# Patient Record
Sex: Female | Born: 2000 | Race: Black or African American | Hispanic: No | Marital: Single | Smoking: Never smoker
Health system: Southern US, Community
[De-identification: ages and names within clinical notes are randomized; demographics above are authoritative.]

## PROBLEM LIST (undated history)

## (undated) DIAGNOSIS — F332 Major depressive disorder, recurrent severe without psychotic features: Secondary | ICD-10-CM

## (undated) DIAGNOSIS — E669 Obesity, unspecified: Secondary | ICD-10-CM

---
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
## 2019-09-26 ENCOUNTER — Other Ambulatory Visit: Payer: Self-pay

## 2019-09-26 ENCOUNTER — Emergency Department (HOSPITAL_COMMUNITY): Payer: Self-pay

## 2019-09-26 ENCOUNTER — Encounter (HOSPITAL_COMMUNITY): Payer: Self-pay

## 2019-09-26 ENCOUNTER — Emergency Department (HOSPITAL_COMMUNITY)
Admission: EM | Admit: 2019-09-26 | Discharge: 2019-09-26 | Disposition: A | Discharge status: Home / Self Care | Payer: Self-pay | Attending: Emergency Medicine | Admitting: Emergency Medicine

## 2019-09-26 DIAGNOSIS — N3000 Acute cystitis without hematuria: Secondary | ICD-10-CM | POA: Insufficient documentation

## 2019-09-26 DIAGNOSIS — N83201 Unspecified ovarian cyst, right side: Secondary | ICD-10-CM | POA: Insufficient documentation

## 2019-09-26 DIAGNOSIS — N739 Female pelvic inflammatory disease, unspecified: Secondary | ICD-10-CM | POA: Insufficient documentation

## 2019-09-26 DIAGNOSIS — Z113 Encounter for screening for infections with a predominantly sexual mode of transmission: Secondary | ICD-10-CM | POA: Insufficient documentation

## 2019-09-26 DIAGNOSIS — N730 Acute parametritis and pelvic cellulitis: Secondary | ICD-10-CM

## 2019-09-26 DIAGNOSIS — M549 Dorsalgia, unspecified: Secondary | ICD-10-CM | POA: Insufficient documentation

## 2019-09-26 DIAGNOSIS — R1032 Left lower quadrant pain: Secondary | ICD-10-CM | POA: Insufficient documentation

## 2019-09-26 LAB — LIPASE, BLOOD: Lipase: 22 U/L (ref 11–51)

## 2019-09-26 LAB — COMPREHENSIVE METABOLIC PANEL
ALT: 17 U/L (ref 0–44)
AST: 13 U/L — ABNORMAL LOW (ref 15–41)
Albumin: 4.1 g/dL (ref 3.5–5.0)
Alkaline Phosphatase: 52 U/L (ref 38–126)
Anion gap: 11 (ref 5–15)
BUN: 13 mg/dL (ref 6–20)
CO2: 25 mmol/L (ref 22–32)
Calcium: 9.3 mg/dL (ref 8.9–10.3)
Chloride: 103 mmol/L (ref 98–111)
Creatinine, Ser: 0.62 mg/dL (ref 0.44–1.00)
GFR calc Af Amer: >60 mL/min (ref >60)
GFR calc non Af Amer: >60 mL/min (ref >60)
Glucose, Bld: 79 mg/dL (ref 70–99)
Potassium: 3.4 mmol/L — ABNORMAL LOW (ref 3.5–5.1)
Sodium: 139 mmol/L (ref 135–145)
Total Bilirubin: 1 mg/dL (ref 0.3–1.2)
Total Protein: 7.9 g/dL (ref 6.5–8.1)

## 2019-09-26 LAB — WET PREP, GENITAL
Clue Cells Wet Prep HPF POC: NONE SEEN
Sperm: NONE SEEN
Trich, Wet Prep: NONE SEEN
Yeast Wet Prep HPF POC: NONE SEEN

## 2019-09-26 LAB — URINALYSIS, ROUTINE W REFLEX MICROSCOPIC
Bacteria, UA: NONE SEEN
Bilirubin Urine: NEGATIVE
Glucose, UA: NEGATIVE mg/dL
Hgb urine dipstick: NEGATIVE
Ketones, ur: 80 mg/dL — AB
Leukocytes,Ua: NEGATIVE
Nitrite: POSITIVE — AB
Protein, ur: 100 mg/dL — AB
Specific Gravity, Urine: 1.03 (ref 1.005–1.030)
pH: 6 (ref 5.0–8.0)

## 2019-09-26 LAB — CBC
HCT: 34.6 % — ABNORMAL LOW (ref 36.0–46.0)
Hemoglobin: 10.6 g/dL — ABNORMAL LOW (ref 12.0–15.0)
MCH: 25.7 pg — ABNORMAL LOW (ref 26.0–34.0)
MCHC: 30.6 g/dL (ref 30.0–36.0)
MCV: 83.8 fL (ref 80.0–100.0)
Platelets: 267 10*3/uL (ref 150–400)
RBC: 4.13 MIL/uL (ref 3.87–5.11)
RDW: 17.5 % — ABNORMAL HIGH (ref 11.5–15.5)
WBC: 15.3 10*3/uL — ABNORMAL HIGH (ref 4.0–10.5)
nRBC: 0 % (ref 0.0–0.2)

## 2019-09-26 LAB — HCG, QUANTITATIVE, PREGNANCY: hCG, Beta Chain, Quant, S: <1 m[IU]/mL (ref <5)

## 2019-09-26 MED ORDER — AZITHROMYCIN 1 G PO PACK
1.0000 g | PACK | Freq: Once | ORAL | Status: AC
  Administered 2019-09-26: 1 g via ORAL
  Filled 2019-09-26: qty 1

## 2019-09-26 MED ORDER — SODIUM CHLORIDE 0.9% FLUSH: 3.0000 mL | Freq: Once | INTRAVENOUS | Status: DC

## 2019-09-26 MED ORDER — CEFTRIAXONE SODIUM 1 G IJ SOLR
500.0000 mg | Freq: Once | INTRAMUSCULAR | Status: AC
Start: 2019-09-26 — End: 2019-09-26
  Administered 2019-09-26: 500 mg via INTRAMUSCULAR
  Filled 2019-09-26: qty 10

## 2019-09-26 MED ORDER — DOXYCYCLINE HYCLATE 100 MG PO CAPS: 100.0000 mg | ORAL_CAPSULE | Freq: Two times a day (BID) | ORAL | 0 refills | Status: DC

## 2019-09-26 MED ORDER — SODIUM CHLORIDE 0.9 % IV BOLUS
1000.0000 mL | Freq: Once | INTRAVENOUS | Status: AC
  Administered 2019-09-26: 1000 mL via INTRAVENOUS

## 2019-09-26 NOTE — ED Provider Notes (Signed)
[facility] DEPT Provider Note   CSN: [id number] Arrival date & time: 09/26/19  1241     History Chief Complaint  Patient presents with  . Abdominal Pain  . Back Pain  . Headache    Atianna Peck is a 19 y.o. female hx of depression, here presenting with back pain, abdominal pain, chills, vaginal discharge. Patient states that she has not been feeling well for the last 6 days. She has some occasional back pain and also some dysuria. She is sexually active and had sex several days ago with female. She states that she also has some vaginal discharge as well. She denies any cough or fever. She has not had her Covid vaccine yet but denies any known Covid exposure.  The history is provided by the patient.       Past Medical History:  Diagnosis Date  . MDD (major depressive disorder), recurrent severe, without psychosis ([place]) 02/01/2016  . Obesity     Patient Active Problem List   Diagnosis Date Noted  . DMDD (disruptive mood dysregulation disorder) ([employer]) 04/12/2016  . MDD (major depressive disorder), recurrent episode, severe ([place]) 04/11/2016  . Iron deficiency anemia 02/02/2016  . Suicidal ideation 02/01/2016  . MDD (major depressive disorder), recurrent severe, without psychosis ([place]) 02/01/2016    History reviewed. No pertinent surgical history.   OB History   No obstetric history on file.     Family History  Problem Relation Age of Onset  . Kidney disease Mother     Social History   Tobacco Use  . Smoking status: Never Smoker  . Smokeless tobacco: Never Used  Substance Use Topics  . Alcohol use: No  . Drug use: Yes    Frequency: 1.0 times per week    Types: Marijuana    Comment: last use was yesterday    Home Medications Prior to Admission medications   Medication Sig Start Date End Date Taking? Authorizing Provider  ARIPiprazole (ABILIFY) 5 MG tablet Take 1 tablet (5 mg total) by mouth daily. Patient not taking: Reported  on 11/25/2017 04/18/16   Suella Broad, FNP  ferrous sulfate 325 (65 FE) MG tablet Take 1 tablet (325 mg total) by mouth daily with breakfast. Patient not taking: Reported on 11/25/2017 02/05/16   Mordecai Maes, NP    Allergies    Patient has no known allergies.  Review of Systems   Review of Systems  Gastrointestinal: Positive for abdominal pain.  Musculoskeletal: Positive for back pain.  Neurological: Positive for headaches.  All other systems reviewed and are negative.   Physical Exam Updated Vital Signs BP 122/67 (BP Location: Left Arm)   Pulse 71   Temp 98.5 F (36.9 C) (Oral)   Resp 20   Ht 5\' 4"  (1.626 m)   Wt 97.4 kg   LMP 09/19/2019   SpO2 100%   BMI 36.87 kg/m   Physical Exam Vitals and nursing note reviewed.  Constitutional:      Appearance: She is well-developed.  HENT:     Head: Normocephalic.     Comments: No meningeal signs  Eyes:     Extraocular Movements: Extraocular movements intact.  Cardiovascular:     Rate and Rhythm: Normal rate and regular rhythm.  Pulmonary:     Effort: Pulmonary effort is normal.     Breath sounds: Normal breath sounds.  Abdominal:     General: Abdomen is flat.     Comments: No CVAT   Genitourinary:    Cervix:  Normal.     Uterus: Normal.      Comments: Yellowish discharge. Mild L adnexal tenderness  Skin:    General: Skin is warm.     Capillary Refill: Capillary refill takes less than 2 seconds.  Neurological:     General: No focal deficit present.     Mental Status: She is alert.  Psychiatric:        Mood and Affect: Mood normal.        Behavior: Behavior normal.     ED Results / Procedures / Treatments   Labs (all labs ordered are listed, but only abnormal results are displayed) Labs Reviewed  COMPREHENSIVE METABOLIC PANEL - Abnormal; Notable for the following components:      Result Value   Potassium 3.4 (*)    AST 13 (*)    All other components within normal limits  CBC - Abnormal; Notable  for the following components:   WBC 15.3 (*)    Hemoglobin 10.6 (*)    HCT 34.6 (*)    MCH 25.7 (*)    RDW 17.5 (*)    All other components within normal limits  WET PREP, GENITAL  URINE CULTURE  LIPASE, BLOOD  HCG, QUANTITATIVE, PREGNANCY  URINALYSIS, ROUTINE W REFLEX MICROSCOPIC  GC/CHLAMYDIA PROBE AMP ([place]) NOT AT [facility]    EKG None  Radiology No results found.  Procedures Procedures (including critical care time)  Medications Ordered in ED Medications  sodium chloride flush (NS) 0.9 % injection 3 mL (has no administration in time range)  sodium chloride 0.9 % bolus 1,000 mL (has no administration in time range)  cefTRIAXone (ROCEPHIN) injection 500 mg (has no administration in time range)  azithromycin (ZITHROMAX) powder 1 g (has no administration in time range)    ED Course  I have reviewed the triage vital signs and the nursing notes.  Pertinent labs & imaging results that were available during my care of the patient were reviewed by me and considered in my medical decision making (see chart for details).    MDM Rules/Calculators/A&P                      Laura Peck is a 19 y.o. female here with chills, back pain, dysuria, vaginal discharge. Patient has low-grade temperature but otherwise well-appearing. Suspect PID versus cystitis. Patient has no meningeal signs and has no cough and has clear lungs . Will get CBC, CMP, urinalysis, wet prep, GC and chlamydia.  10:45 PM Patient's labs showed WBC 15.  Pelvic exam has mild left adnexal tenderness.  Ultrasound showed complex cyst on the right side likely hemorrhagic cysts and small left-sided hydrosalpinx.  She may have PID.  She was given a dose of Rocephin and azithromycin.  Patient will be discharged home with doxycycline for PID and to treat her UTI.  I told her that she will be called if she has positive gonorrhea or chlamydia test and her partner will need treatment if that is the case.  She will need to  GYN follow-up for repeat pelvic ultrasound.   Final Clinical Impression(s) / ED Diagnoses Final diagnoses:  None    Rx / DC Orders ED Discharge Orders    None       Charlynne Pander, MD 09/26/19 2247

## 2019-09-26 NOTE — ED Notes (Signed)
ED Provider at bedside. 

## 2019-09-26 NOTE — Discharge Instructions (Addendum)
Take doxycycline twice daily for a week   You are tested for gonorrhea and chlamydia and we will call for positive results.  In that case, your partner will need treatment.  Please use condoms.  You have an ovarian cyst that will need follow-up with GYN doctor and repeat ultrasound in 6 weeks.  Return to ER if you have worse abdominal pain, vaginal discharge, flank pain, vomiting, fever

## 2019-09-26 NOTE — ED Triage Notes (Signed)
Patient c/o headache, back pain, abdominal pain, and chills x 6 days ago.

## 2019-09-27 LAB — URINE CULTURE

## 2019-09-28 LAB — GC/CHLAMYDIA PROBE AMP (~~LOC~~) NOT AT ARMC
Chlamydia: NEGATIVE
Comment: NEGATIVE
Comment: NORMAL
Neisseria Gonorrhea: POSITIVE — AB

## 2020-05-31 IMAGING — US US PELVIS COMPLETE TRANSABD/TRANSVAG W DUPLEX
1 series · 13 of 25 positions shown · non-contrast
Comparison: None available.

CLINICAL DATA: Initial evaluation for acute left lower quadrant
pain for 1 week.

EXAM:
TRANSABDOMINAL AND TRANSVAGINAL ULTRASOUND OF PELVIS
DOPPLER ULTRASOUND OF OVARIES
TECHNIQUE: Both transabdominal and transvaginal ultrasound examinations of the
pelvis were performed. Transabdominal technique was performed for
global imaging of the pelvis including uterus, ovaries, adnexal
regions, and pelvic cul-de-sac.
It was necessary to proceed with endovaginal exam following the
transabdominal exam to visualize the pelvic structures. Color and
duplex Doppler ultrasound was utilized to evaluate blood flow to the
ovaries.

[Series 1: us pelvis complete transabd/transvag w duplex · 100 acquisitions, 13 frames shown]
[im 1/100]
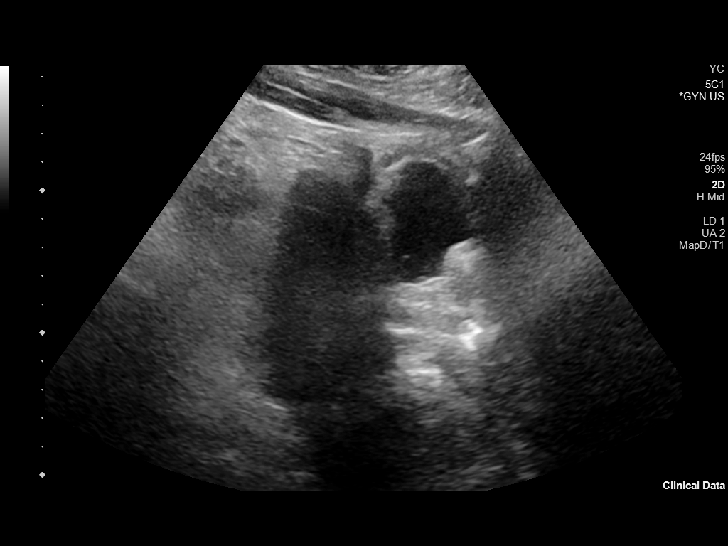
[im 9/100]
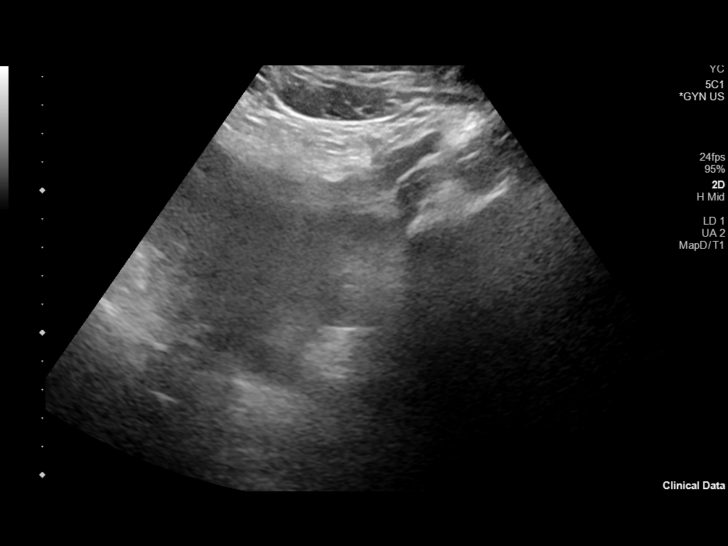
[im 17/100]
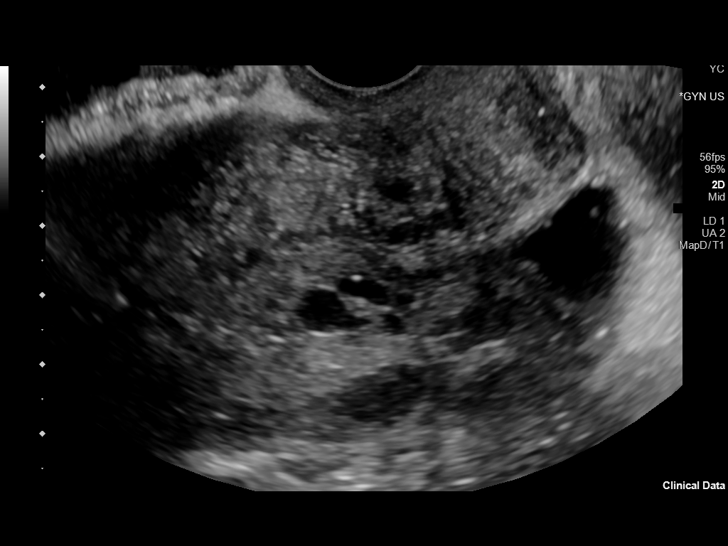
[im 25/100]
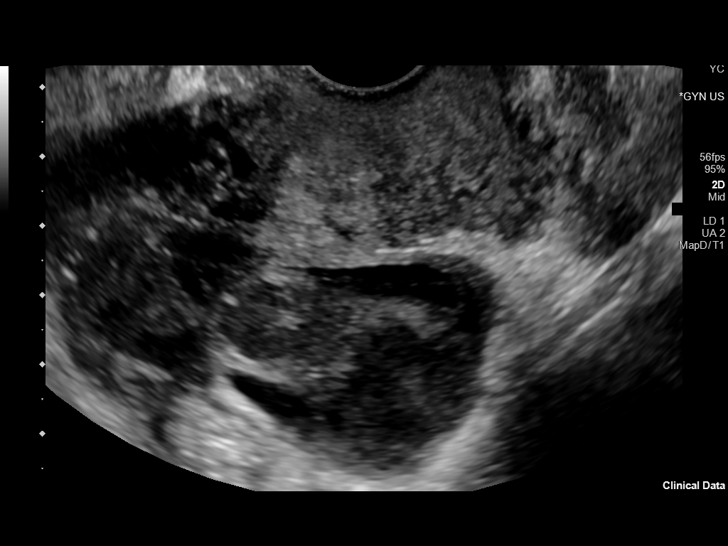
[im 34/100]
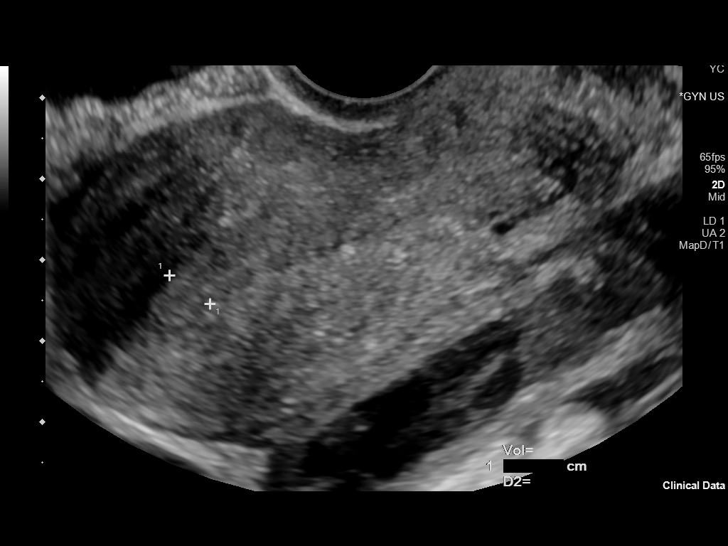
[im 42/100]
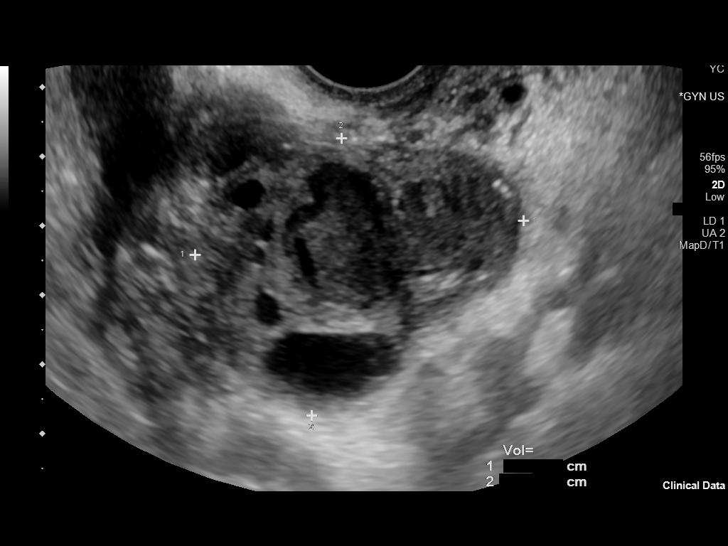
[im 50/100]
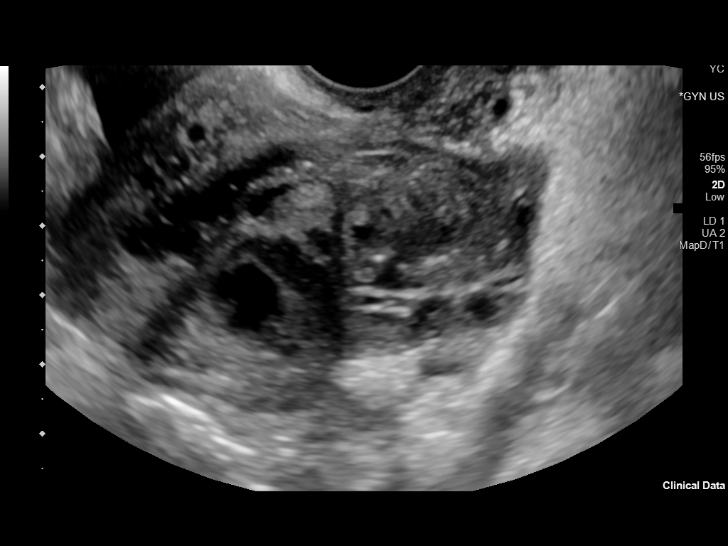
[im 58/100]
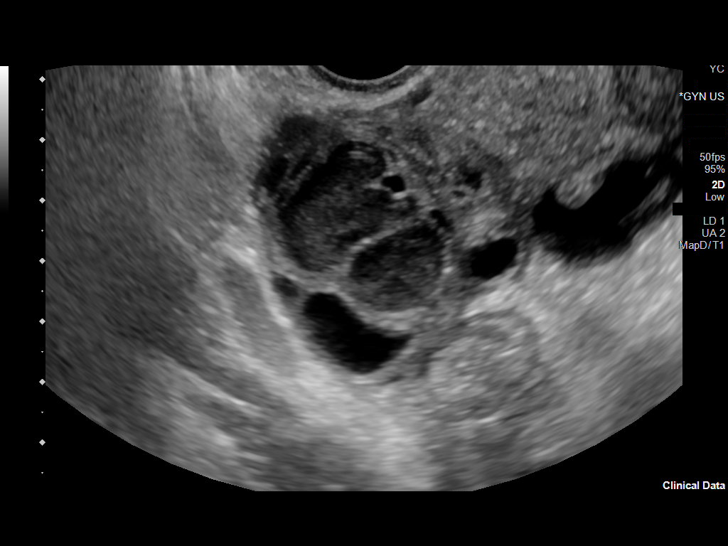
[im 67/100]
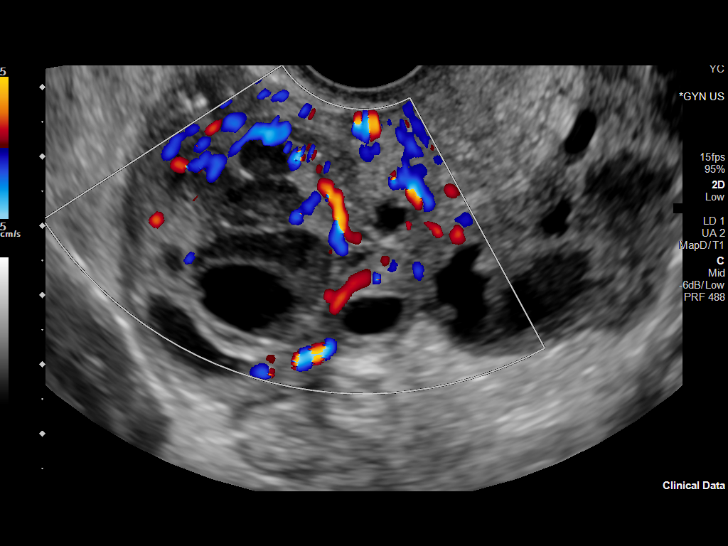
[im 75/100]
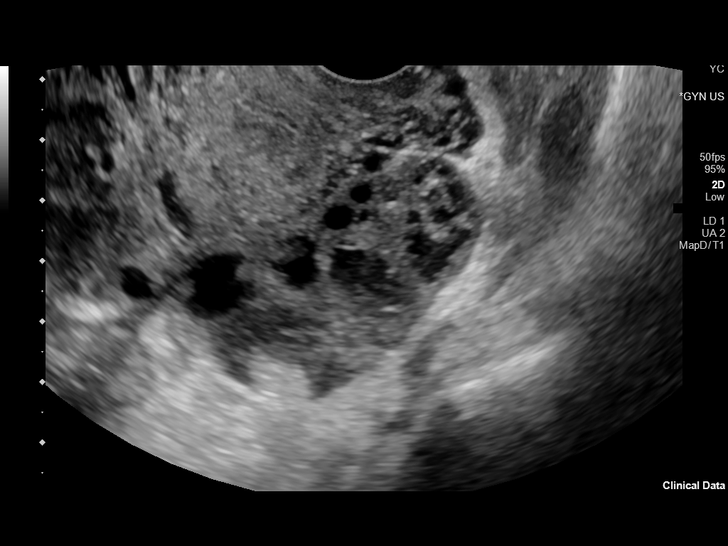
[im 83/100]
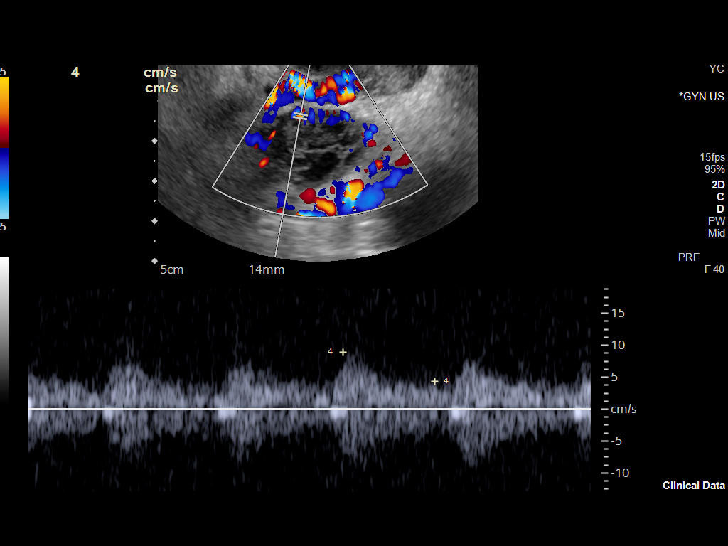
[im 91/100]
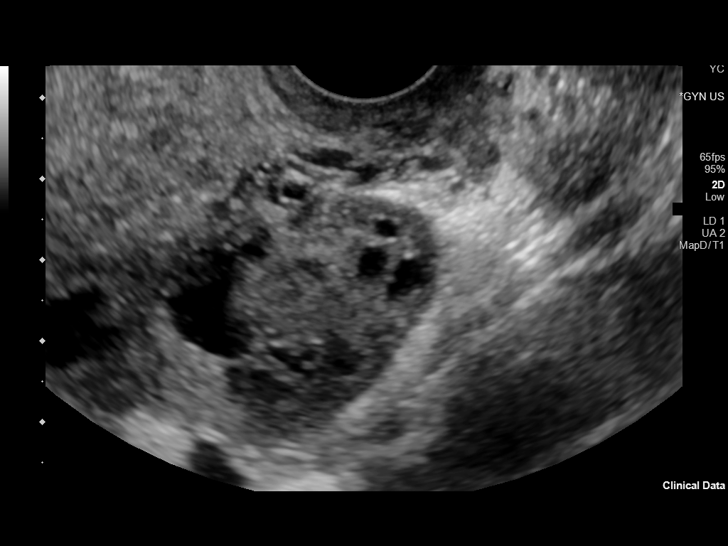
[im 100/100]
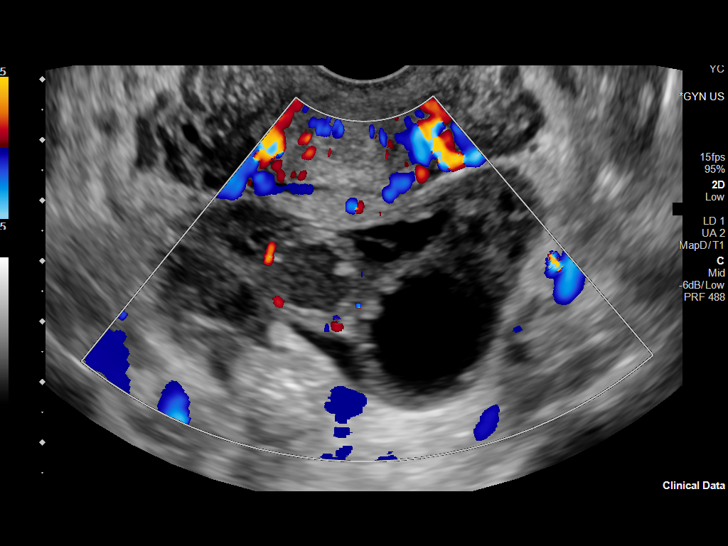

[13 of 25 positions shown; findings below may reference images not displayed]

FINDINGS: Uterus

Measurements: 8.5 x 4.1 x 4.3 cm = volume: 78.4 mL. No fibroids or
other mass visualized.

Endometrium

Thickness: 6.1 mm.  No focal abnormality visualized.

Right ovary

Measurements: 4.8 x 4.0 x 3.5 cm = volume: 35.5 mL. Complex cyst
versus cluster of cysts measuring 3.7 x 2.7 x 2.5 cm seen within the
right ovary. Scattered areas of vascularity favored to lie within
intervening ovarian parenchyma. No definite solid component.

Left ovary

Measurements: 4.1 x 2.4 x 2.6 cm = volume: 13.6 mL. Left ovary
demonstrates a normal appearance. Probable small left-sided
hydrosalpinx noted. No other adnexal mass.

Pulsed Doppler evaluation of both ovaries demonstrates normal
low-resistance arterial and venous waveforms.

Other findings

Small to moderate free fluid within the pelvis.
IMPRESSION: 1. Complex cyst versus cluster of cysts measuring up to 3.7 cm
arising from the right ovary, favored to reflect complex physiologic
follicular cyst(s) versus hemorrhagic cyst(s). While these are
almost certainly benign, a short interval follow-up ultrasound in
6-12 weeks to ensure resolution is suggested.
2. Probable small left-sided hydrosalpinx.
3. Small to moderate free fluid within the pelvis, likely
physiologic.
4. No other acute abnormality identified.  No evidence for torsion.

## 2022-12-14 DIAGNOSIS — B9789 Other viral agents as the cause of diseases classified elsewhere: Secondary | ICD-10-CM | POA: Diagnosis not present

## 2022-12-14 DIAGNOSIS — L299 Pruritus, unspecified: Secondary | ICD-10-CM | POA: Diagnosis not present

## 2022-12-14 DIAGNOSIS — N760 Acute vaginitis: Secondary | ICD-10-CM | POA: Diagnosis not present

## 2022-12-14 DIAGNOSIS — Z556 Problems related to health literacy: Secondary | ICD-10-CM | POA: Diagnosis not present

## 2022-12-14 DIAGNOSIS — N898 Other specified noninflammatory disorders of vagina: Secondary | ICD-10-CM | POA: Diagnosis not present

## 2023-01-11 DIAGNOSIS — B368 Other specified superficial mycoses: Secondary | ICD-10-CM | POA: Diagnosis not present

## 2023-01-11 DIAGNOSIS — R21 Rash and other nonspecific skin eruption: Secondary | ICD-10-CM | POA: Diagnosis not present

## 2023-01-11 DIAGNOSIS — B369 Superficial mycosis, unspecified: Secondary | ICD-10-CM | POA: Diagnosis not present

## 2023-03-09 DIAGNOSIS — L723 Sebaceous cyst: Secondary | ICD-10-CM | POA: Diagnosis not present

## 2024-04-04 ENCOUNTER — Emergency Department (HOSPITAL_COMMUNITY)
Admission: EM | Admit: 2024-04-04 | Discharge: 2024-04-05 | Disposition: A | Discharge status: Home / Self Care | Attending: Emergency Medicine | Admitting: Emergency Medicine

## 2024-04-04 DIAGNOSIS — N760 Acute vaginitis: Secondary | ICD-10-CM | POA: Insufficient documentation

## 2024-04-04 DIAGNOSIS — N946 Dysmenorrhea, unspecified: Secondary | ICD-10-CM | POA: Insufficient documentation

## 2024-04-04 DIAGNOSIS — N898 Other specified noninflammatory disorders of vagina: Secondary | ICD-10-CM | POA: Diagnosis present

## 2024-04-04 DIAGNOSIS — B9689 Other specified bacterial agents as the cause of diseases classified elsewhere: Secondary | ICD-10-CM

## 2024-04-04 LAB — CBC WITH DIFFERENTIAL/PLATELET
Abs Immature Granulocytes: 0.03 K/uL (ref 0.00–0.07)
Basophils Absolute: 0.1 K/uL (ref 0.0–0.1)
Basophils Relative: 1 %
Eosinophils Absolute: 0.1 K/uL (ref 0.0–0.5)
Eosinophils Relative: 2 %
HCT: 31.2 % — ABNORMAL LOW (ref 36.0–46.0)
Hemoglobin: 9.7 g/dL — ABNORMAL LOW (ref 12.0–15.0)
Immature Granulocytes: 0 %
Lymphocytes Relative: 32 %
Lymphs Abs: 3 K/uL (ref 0.7–4.0)
MCH: 25.7 pg — ABNORMAL LOW (ref 26.0–34.0)
MCHC: 31.1 g/dL (ref 30.0–36.0)
MCV: 82.5 fL (ref 80.0–100.0)
Monocytes Absolute: 0.8 K/uL (ref 0.1–1.0)
Monocytes Relative: 9 %
Neutro Abs: 5.3 K/uL (ref 1.7–7.7)
Neutrophils Relative %: 56 %
Platelets: 271 K/uL (ref 150–400)
RBC: 3.78 MIL/uL — ABNORMAL LOW (ref 3.87–5.11)
RDW: 16.6 % — ABNORMAL HIGH (ref 11.5–15.5)
WBC: 9.4 K/uL (ref 4.0–10.5)
nRBC: 0 % (ref 0.0–0.2)

## 2024-04-04 LAB — COMPREHENSIVE METABOLIC PANEL WITH GFR
ALT: 9 U/L (ref 0–44)
AST: 19 U/L (ref 15–41)
Albumin: 4.2 g/dL (ref 3.5–5.0)
Alkaline Phosphatase: 46 U/L (ref 38–126)
Anion gap: 10 (ref 5–15)
BUN: 12 mg/dL (ref 6–20)
CO2: 22 mmol/L (ref 22–32)
Calcium: 9.2 mg/dL (ref 8.9–10.3)
Chloride: 106 mmol/L (ref 98–111)
Creatinine, Ser: 0.73 mg/dL (ref 0.44–1.00)
GFR, Estimated: >60 mL/min (ref >60)
Glucose, Bld: 114 mg/dL — ABNORMAL HIGH (ref 70–99)
Potassium: 3.6 mmol/L (ref 3.5–5.1)
Sodium: 138 mmol/L (ref 135–145)
Total Bilirubin: 0.3 mg/dL (ref 0.0–1.2)
Total Protein: 7 g/dL (ref 6.5–8.1)

## 2024-04-04 LAB — HCG, SERUM, QUALITATIVE: Preg, Serum: NEGATIVE

## 2024-04-04 NOTE — ED Triage Notes (Signed)
 Patient brought in by POV with c/o spotting for 2 weeks. Denies seeing OBGYN. Denies any burning or foul odor. Some itching.

## 2024-04-04 NOTE — ED Notes (Signed)
Pt unable to provide urine sample at this time. Water given to pt.

## 2024-04-04 NOTE — ED Provider Notes (Signed)
 [place] EMERGENCY DEPARTMENT AT [facility] Provider Note   CSN: [id number] Arrival date & time: 04/04/24  2149     Patient presents with: No chief complaint on file.   Laura Peck is a 23 y.o. female.  Patient is  a 23 year old female with a history of iron deficiency anemia who presents to the ED for vaginal spotting for the past 2 months.  She also notes she has had some increased vaginal discharge.  She states her last normal menstrual cycle was approximately a month and a half ago.  She notes her period initially started normally this month but she has had continued spotting.  Denies concerns for pregnancy or STDs.  Denies fevers, abdominal pain, malodorous to the vaginal area, dysuria, hematuria.  Has not noticed any rashes.  No further complaints.   HPI     Prior to Admission medications   Medication Sig Start Date End Date Taking? Authorizing Provider  metroNIDAZOLE  (FLAGYL ) 500 MG tablet Take 1 tablet (500 mg total) by mouth 2 (two) times daily. 04/05/24  Yes Makenzy Krist, Thersia RAMAN, PA-C  ARIPiprazole  (ABILIFY ) 5 MG tablet Take 1 tablet (5 mg total) by mouth daily. Patient not taking: Reported on 11/25/2017 04/18/16   Wilkie Majel RAMAN, FNP  doxycycline  (VIBRAMYCIN ) 100 MG capsule Take 1 capsule (100 mg total) by mouth 2 (two) times daily. One po bid x 7 days 09/26/19   Patt Alm Macho, MD  ferrous sulfate  325 (65 FE) MG tablet Take 1 tablet (325 mg total) by mouth daily with breakfast. Patient not taking: Reported on 11/25/2017 02/05/16   Thomas, Lashunda, NP    Allergies: Patient has no known allergies.    Review of Systems  Constitutional:  Negative for chills and fever.  Respiratory:  Negative for shortness of breath.   Cardiovascular:  Negative for chest pain.  Genitourinary:  Positive for vaginal bleeding and vaginal discharge. Negative for decreased urine volume, dysuria, hematuria and vaginal pain.  Neurological:  Negative for dizziness and  headaches.  All other systems reviewed and are negative.   Updated Vital Signs BP 120/63   Pulse 90   Temp 98.9 F (37.2 C) (Oral)   Resp 18   Ht 5' 4 (1.626 m)   Wt 95.3 kg   SpO2 100%   BMI 36.05 kg/m   Physical Exam Constitutional:      Appearance: Normal appearance.  HENT:     Head: Normocephalic and atraumatic.     Nose: Nose normal.     Mouth/Throat:     Mouth: Mucous membranes are moist.     Pharynx: Oropharynx is clear.  Cardiovascular:     Rate and Rhythm: Normal rate.  Pulmonary:     Effort: Pulmonary effort is normal.  Abdominal:     General: Bowel sounds are normal.     Palpations: Abdomen is soft.     Tenderness: There is no abdominal tenderness.  Skin:    General: Skin is warm and dry.  Neurological:     Mental Status: She is alert and oriented to person, place, and time.  Psychiatric:        Mood and Affect: Mood normal.        Behavior: Behavior normal.     (all labs ordered are listed, but only abnormal results are displayed) Labs Reviewed  WET PREP, GENITAL - Abnormal; Notable for the following components:      Result Value   Clue Cells Wet Prep HPF POC PRESENT (*)  All other components within normal limits  URINALYSIS, ROUTINE W REFLEX MICROSCOPIC - Abnormal; Notable for the following components:   APPearance HAZY (*)    Specific Gravity, Urine 1.031 (*)    Hgb urine dipstick MODERATE (*)    Bacteria, UA RARE (*)    All other components within normal limits  COMPREHENSIVE METABOLIC PANEL WITH GFR - Abnormal; Notable for the following components:   Glucose, Bld 114 (*)    All other components within normal limits  CBC WITH DIFFERENTIAL/PLATELET - Abnormal; Notable for the following components:   RBC 3.78 (*)    Hemoglobin 9.7 (*)    HCT 31.2 (*)    MCH 25.7 (*)    RDW 16.6 (*)    All other components within normal limits  HCG, SERUM, QUALITATIVE  GC/CHLAMYDIA PROBE AMP ([place]) NOT AT [facility]    EKG: None  Radiology: No  results found.    Medications Ordered in the ED - No data to display  Clinical Course as of 04/05/24 0109  Tue Apr 04, 2024  2357 Hemoglobin(!): 9.7 Slightly decreased from baseline but stable [AY]  Wed Apr 05, 2024  0024 Clue Cells Wet Prep HPF POC(!): PRESENT [AY]    Clinical Course User Index [AY] Neysa Thersia RAMAN, PA-C                                Medical Decision Making Amount and/or Complexity of Data Reviewed Labs: ordered. Decision-making details documented in ED Course.  Risk Prescription drug management.   Patient is a 23 year old female with a history of anemia who presents to the ED for abnormal menstrual bleeding for the past 2 weeks as well as vaginal discharge.  Please see detailed HPI above.  On exam patient is alert and in no acute distress.  Physical exam as noted above.  No abdominal or suprapubic tenderness appreciated.    Differential includes UTI, BV, STI, pregnancy, ruptured ovarian cyst, endometriosis.  Labs show mildly decreased hemoglobin of 9.7, previous 4 years prior was 10.6.  UA does show hematuria but no signs of infection.  She notes she is still having some vaginal spotting.  Pregnancy negative.  Wet prep is positive for BV.  Suspect patient's hemoglobin is decreased secondary to intermittent vaginal bleeding for 2 weeks. She is not symptomatic and stable.  Stable for discharge home.  Prescribed Flagyl  for BV.  Symptomatic care discussed.  Resources for gynecology provided for follow-up for continued abnormal menstrual cycles.  Return precautions provided for worsening symptoms.    Final diagnoses:  BV (bacterial vaginosis)  Dysmenorrhea    ED Discharge Orders          Ordered    metroNIDAZOLE  (FLAGYL ) 500 MG tablet  2 times daily        04/05/24 0103               Neysa Thersia RAMAN, PA-C 04/05/24 0109    Lenor Hollering, MD 04/11/24 1501

## 2024-04-05 LAB — URINALYSIS, ROUTINE W REFLEX MICROSCOPIC
Bilirubin Urine: NEGATIVE
Glucose, UA: NEGATIVE mg/dL
Ketones, ur: NEGATIVE mg/dL
Leukocytes,Ua: NEGATIVE
Nitrite: NEGATIVE
Protein, ur: NEGATIVE mg/dL
Specific Gravity, Urine: 1.031 — ABNORMAL HIGH (ref 1.005–1.030)
pH: 5 (ref 5.0–8.0)

## 2024-04-05 LAB — GC/CHLAMYDIA PROBE AMP (~~LOC~~) NOT AT ARMC
Chlamydia: NEGATIVE
Comment: NEGATIVE
Comment: NORMAL
Neisseria Gonorrhea: NEGATIVE

## 2024-04-05 LAB — WET PREP, GENITAL
Sperm: NONE SEEN
Trich, Wet Prep: NONE SEEN
WBC, Wet Prep HPF POC: <10 (ref <10)
Yeast Wet Prep HPF POC: NONE SEEN

## 2024-04-05 MED ORDER — METRONIDAZOLE 500 MG PO TABS: 500.0000 mg | ORAL_TABLET | Freq: Two times a day (BID) | ORAL | 0 refills | Status: DC

## 2024-04-05 NOTE — Discharge Instructions (Addendum)
 Began to take Flagyl  as prescribed.  Please follow-up with gynecology for further evaluation and management of abnormal menstrual cycles.  Call to schedule appointment.  Return to ED if any symptoms worsen including severe uncontrollable bleeding, fatigue/weakness/syncope, severe abdominal pain.

## 2024-04-28 ENCOUNTER — Ambulatory Visit (HOSPITAL_COMMUNITY)
Admission: EM | Admit: 2024-04-28 | Discharge: 2024-04-28 | Disposition: A | Discharge status: Home / Self Care | Attending: Emergency Medicine | Admitting: Emergency Medicine

## 2024-04-28 DIAGNOSIS — Z3202 Encounter for pregnancy test, result negative: Secondary | ICD-10-CM | POA: Diagnosis not present

## 2024-04-28 DIAGNOSIS — N898 Other specified noninflammatory disorders of vagina: Secondary | ICD-10-CM | POA: Insufficient documentation

## 2024-04-28 DIAGNOSIS — Z113 Encounter for screening for infections with a predominantly sexual mode of transmission: Secondary | ICD-10-CM | POA: Insufficient documentation

## 2024-04-28 LAB — POCT URINE PREGNANCY: Preg Test, Ur: NEGATIVE

## 2024-04-28 NOTE — ED Provider Notes (Signed)
 " [facility]    CSN: [id number] Arrival date & time: 04/28/24  1620      History   Chief Complaint Chief Complaint  Patient presents with   Exposure to STD    HPI Laura Peck is a 23 y.o. female.   Patient presents requesting STD testing.  Patient states that she has had some on and off vaginal itching over the last week or so.  Patient denies any abnormal vaginal discharge, vaginal lesions, vaginal pain, abnormal vaginal bleeding, abdominal pain, dysuria, hematuria, and urinary frequency/urgency.  Patient also denies any known exposures to STDs.  Patient also presents with a form to give her clearance to donate plasma and is requesting that we check her iron levels today for this.  Patient reports that she does not currently have a primary care provider.  The history is provided by the patient and medical records.  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  OB History   No obstetric history on file.      Home Medications    Prior to Admission medications  Not on File    Family History Family History  Problem Relation Age of Onset   Kidney disease Mother     Social History Social History[1]   Allergies   Patient has no known allergies.   Review of Systems Review of Systems  Per HPI  Physical Exam Triage Vital Signs ED Triage Vitals [04/28/24 1804]  Encounter Vitals Group     BP 116/60     Girls Systolic BP Percentile      Girls  Diastolic BP Percentile      Boys Systolic BP Percentile      Boys Diastolic BP Percentile      Pulse Rate 62     Resp 18     Temp 98.3 F (36.8 C)     Temp Source Oral     SpO2 98 %     Weight      Height      Head Circumference      Peak Flow      Pain Score      Pain Loc      Pain Education      Exclude from Growth Chart    No data found.  Updated Vital Signs BP 116/60 (BP Location: Left Arm)   Pulse 62   Temp 98.3 F (36.8 C) (Oral)   Resp 18   LMP 03/27/2024 (Approximate)   SpO2 98%   Visual Acuity Right Eye Distance:   Left Eye Distance:   Bilateral Distance:    Right Eye Near:   Left Eye Near:    Bilateral Near:     Physical Exam Vitals and nursing note reviewed.  Constitutional:      General: She is awake. She is not in acute distress.    Appearance: Normal appearance. She is well-developed and well-groomed. She is not ill-appearing.  Genitourinary:    Comments: Exam deferred Skin:    General: Skin  is warm and dry.  Neurological:     Mental Status: She is alert.  Psychiatric:        Behavior: Behavior is cooperative.      UC Treatments / Results  Labs (all labs ordered are listed, but only abnormal results are displayed) Labs Reviewed  POCT URINE PREGNANCY - Normal  HIV ANTIBODY (ROUTINE TESTING W REFLEX)  SYPHILIS: RPR W/REFLEX TO RPR TITER AND TREPONEMAL ANTIBODIES, TRADITIONAL SCREENING AND DIAGNOSIS ALGORITHM  CERVICOVAGINAL ANCILLARY ONLY    EKG   Radiology No results found.  Procedures Procedures (including critical care time)  Medications Ordered in UC Medications - No data to display  Initial Impression / Assessment and Plan / UC Course  I have reviewed the triage vital signs and the nursing notes.  Pertinent labs & imaging results that were available during my care of the patient were reviewed by me and considered in my medical decision making (see chart for details).     Patient is overall well-appearing.  Vitals  are stable.  UPT negative.  GU exam deferred.  Patient perform self swab for STD/STI.  HIV and RPR ordered.  Discussed with patient that we cannot fill out forms clearance to donate plasma and this would have to come from a PCP.  Provided information to get established with a PCP.  Discussed follow-up and return precautions. Final Clinical Impressions(s) / UC Diagnoses   Final diagnoses:  Vaginal itching  Screen for STD (sexually transmitted disease)     Discharge Instructions      Your results will come back over the next few days and someone will call if results are positive and require treatment.  I have attached a family medicine doctor that you can follow-up with to get established with a primary care provider. Return here as needed.    ED Prescriptions   None    PDMP not reviewed this encounter.    [1]  Social History Tobacco Use   Smoking status: Never   Smokeless tobacco: Never  Vaping Use   Vaping status: Never Used  Substance Use Topics   Alcohol use: No   Drug use: Yes    Frequency: 1.0 times per week    Types: Marijuana    Comment: last use was yesterday     Johnie Rumaldo LABOR, NP 04/28/24 1836  "

## 2024-04-28 NOTE — ED Triage Notes (Addendum)
 Patient is here for STI testing.Denies any symptoms. Patient would like her iron check. She has a form to complete so she can donate plasma for money.

## 2024-04-28 NOTE — Discharge Instructions (Signed)
 Your results will come back over the next few days and someone will call if results are positive and require treatment.  I have attached a family medicine doctor that you can follow-up with to get established with a primary care provider. Return here as needed.

## 2024-04-29 LAB — SYPHILIS: RPR W/REFLEX TO RPR TITER AND TREPONEMAL ANTIBODIES, TRADITIONAL SCREENING AND DIAGNOSIS ALGORITHM: RPR Ser Ql: NONREACTIVE

## 2024-04-29 LAB — HIV ANTIBODY (ROUTINE TESTING W REFLEX): HIV Screen 4th Generation wRfx: NONREACTIVE

## 2024-05-01 LAB — CERVICOVAGINAL ANCILLARY ONLY
Comment: NEGATIVE
Comment: NEGATIVE
Comment: NEGATIVE
Comment: NEGATIVE
Comment: NEGATIVE
Comment: NORMAL

## 2024-05-03 ENCOUNTER — Ambulatory Visit (HOSPITAL_COMMUNITY): Payer: Self-pay

## 2024-05-12 ENCOUNTER — Other Ambulatory Visit: Payer: Self-pay

## 2024-05-12 ENCOUNTER — Emergency Department (HOSPITAL_COMMUNITY)
Admission: EM | Admit: 2024-05-12 | Discharge: 2024-05-12 | Disposition: A | Discharge status: Home / Self Care | Attending: Emergency Medicine | Admitting: Emergency Medicine

## 2024-05-12 ENCOUNTER — Emergency Department (HOSPITAL_COMMUNITY)

## 2024-05-12 DIAGNOSIS — R1011 Right upper quadrant pain: Secondary | ICD-10-CM | POA: Diagnosis not present

## 2024-05-12 DIAGNOSIS — R55 Syncope and collapse: Secondary | ICD-10-CM | POA: Diagnosis not present

## 2024-05-12 DIAGNOSIS — R5383 Other fatigue: Secondary | ICD-10-CM | POA: Diagnosis present

## 2024-05-12 LAB — CBC
HCT: 34.2 % — ABNORMAL LOW (ref 36.0–46.0)
Hemoglobin: 10.4 g/dL — ABNORMAL LOW (ref 12.0–15.0)
MCH: 26.1 pg (ref 26.0–34.0)
MCHC: 30.4 g/dL (ref 30.0–36.0)
MCV: 85.9 fL (ref 80.0–100.0)
Platelets: 188 K/uL (ref 150–400)
RBC: 3.98 MIL/uL (ref 3.87–5.11)
RDW: 17.6 % — ABNORMAL HIGH (ref 11.5–15.5)
WBC: 3.9 K/uL — ABNORMAL LOW (ref 4.0–10.5)
nRBC: 0 % (ref 0.0–0.2)

## 2024-05-12 LAB — COMPREHENSIVE METABOLIC PANEL WITH GFR
ALT: 10 U/L (ref 0–44)
AST: 22 U/L (ref 15–41)
Albumin: 3.3 g/dL — ABNORMAL LOW (ref 3.5–5.0)
Alkaline Phosphatase: 39 U/L (ref 38–126)
Anion gap: 8 (ref 5–15)
BUN: 9 mg/dL (ref 6–20)
CO2: 22 mmol/L (ref 22–32)
Calcium: 7.9 mg/dL — ABNORMAL LOW (ref 8.9–10.3)
Chloride: 108 mmol/L (ref 98–111)
Creatinine, Ser: 1 mg/dL (ref 0.44–1.00)
GFR, Estimated: >60 mL/min
Glucose, Bld: 100 mg/dL — ABNORMAL HIGH (ref 70–99)
Potassium: 4 mmol/L (ref 3.5–5.1)
Sodium: 138 mmol/L (ref 135–145)
Total Bilirubin: <0.2 mg/dL (ref 0.0–1.2)
Total Protein: 5.4 g/dL — ABNORMAL LOW (ref 6.5–8.1)

## 2024-05-12 LAB — CBG MONITORING, ED: Glucose-Capillary: 123 mg/dL — ABNORMAL HIGH (ref 70–99)

## 2024-05-12 LAB — HCG, SERUM, QUALITATIVE: Preg, Serum: NEGATIVE

## 2024-05-12 MED ORDER — SODIUM CHLORIDE 0.9 % IV BOLUS
1000.0000 mL | Freq: Once | INTRAVENOUS | Status: AC
  Administered 2024-05-12: 1000 mL via INTRAVENOUS

## 2024-05-12 NOTE — ED Provider Notes (Signed)
 " [place] EMERGENCY DEPARTMENT AT [facility] Provider Note   CSN: [id number] Arrival date & time: 05/12/24  1228     Patient presents with: Fatigue   Laura Peck is a 24 y.o. female who presents with a primary concern of fatigue and near syncope.  States that she had been feeling unwell this morning, but notes that she gave plasma yesterday, regularly donates plasma, and immediately before the episode felt flushed and mildly nauseated.  States that she did not fully syncopize, however felt near to fainting.  Denies any nausea or vomiting, denies any abdominal pain however states that she has a funny feeling in the upper right side of her abdomen.  Prior to the near syncopal event, the patient had been eating and drinking well and had no concerning symptoms.  Denies any vertigo but did have dizziness immediately prior to the event, currently does not have any dizziness.  Received 1.5 L of normal saline enroute by EMS.   HPI     Prior to Admission medications  Not on File    Allergies: Patient has no known allergies.    Review of Systems  All other systems reviewed and are negative.   Updated Vital Signs BP 105/65 (BP Location: Right Arm)   Pulse 66   Temp 98.4 F (36.9 C) (Oral)   Resp 14   Ht 5' 4 (1.626 m)   Wt 90.7 kg   LMP 03/27/2024 (Approximate)   SpO2 98%   BMI 34.33 kg/m   Physical Exam Vitals and nursing note reviewed.  Constitutional:      General: She is not in acute distress.    Appearance: Normal appearance.  HENT:     Head: Normocephalic and atraumatic.     Mouth/Throat:     Mouth: Mucous membranes are moist.     Pharynx: Oropharynx is clear. Uvula midline.  Eyes:     Extraocular Movements: Extraocular movements intact.     Conjunctiva/sclera: Conjunctivae normal.     Pupils: Pupils are equal, round, and reactive to light.  Cardiovascular:     Rate and Rhythm: Normal rate and regular rhythm.     Pulses: Normal pulses.     Heart  sounds: Normal heart sounds, S1 normal and S2 normal. No murmur heard.    No friction rub. No gallop.  Pulmonary:     Effort: Pulmonary effort is normal.     Breath sounds: Normal breath sounds and air entry.  Abdominal:     General: Abdomen is flat. Bowel sounds are normal.     Palpations: Abdomen is soft.     Tenderness: There is no abdominal tenderness.  Musculoskeletal:        General: Normal range of motion.     Cervical back: Normal range of motion and neck supple.     Right lower leg: No edema.     Left lower leg: No edema.  Skin:    General: Skin is warm and dry.     Capillary Refill: Capillary refill takes less than 2 seconds.     Comments: Normal skin turgor  Neurological:     General: No focal deficit present.     Mental Status: She is alert and oriented to person, place, and time. Mental status is at baseline.     GCS: GCS eye subscore is 4. GCS verbal subscore is 5. GCS motor subscore is 6.     Cranial Nerves: Cranial nerves 2-12 are intact.     Sensory:  Sensation is intact.     Motor: Motor function is intact.  Psychiatric:        Attention and Perception: Attention and perception normal.        Mood and Affect: Mood normal.     (all labs ordered are listed, but only abnormal results are displayed) Labs Reviewed  COMPREHENSIVE METABOLIC PANEL WITH GFR - Abnormal; Notable for the following components:      Result Value   Glucose, Bld 100 (*)    Calcium 7.9 (*)    Total Protein 5.4 (*)    Albumin 3.3 (*)    All other components within normal limits  CBC - Abnormal; Notable for the following components:   WBC 3.9 (*)    Hemoglobin 10.4 (*)    HCT 34.2 (*)    RDW 17.6 (*)    All other components within normal limits  CBG MONITORING, ED - Abnormal; Notable for the following components:   Glucose-Capillary 123 (*)    All other components within normal limits  HCG, SERUM, QUALITATIVE    EKG: None  Radiology: CT HEAD WO CONTRAST Result Date:  05/12/2024 EXAM: CT HEAD WITHOUT CONTRAST 05/12/2024 01:47:58 PM TECHNIQUE: CT of the head was performed without the administration of intravenous contrast. Automated exposure control, iterative reconstruction, and/or weight based adjustment of the mA/kV was utilized to reduce the radiation dose to as low as reasonably achievable. COMPARISON: None available. CLINICAL HISTORY: Syncope/presyncope, cerebrovascular cause suspected FINDINGS: BRAIN AND VENTRICLES: No acute hemorrhage. No evidence of acute infarct. No hydrocephalus. No extra-axial collection. No mass effect or midline shift. ORBITS: No acute abnormality. SINUSES: Clear paranasal sinuses. SOFT TISSUES AND SKULL: No acute soft tissue abnormality. No skull fracture. Well-aerated mastoid air cells. IMPRESSION: 1. No acute intracranial abnormality. Electronically signed by: Lonni Necessary MD 05/12/2024 02:19 PM EST RP Workstation: [id number]     Procedures   Medications Ordered in the ED  sodium chloride  0.9 % bolus 1,000 mL (0 mLs Intravenous Stopped 05/12/24 1614)    Clinical Course as of 05/13/24 0815  Fri May 12, 2024  1523 Syncope at home. Donates plasma routinely. Likely orthostatic hypotension. Fluids and reasses and likely home.  [JR]    Clinical Course User Index [JR] Lang Norleen POUR, PA-C                                 Medical Decision Making Amount and/or Complexity of Data Reviewed Labs: ordered. Radiology: ordered.   Medical Decision Making:   Laura Peck is a 24 y.o. female who presented to the ED today with near syncope detailed above.    Patient placed on continuous vitals and telemetry monitoring while in ED which was reviewed periodically.  Complete initial physical exam performed, notably the patient  was alert and oriented no apparent distress.  There is specifically no abdominal tenderness to palpation, skin condition is normal-appearing.  Neurologic exam is benign..    Reviewed and confirmed nursing  documentation for past medical history, family history, social history.    Initial Assessment:   With the patient's presentation of near syncope, differential diagnosis includes orthostatic hypotension, vasovagal syncope, acute neurovascular injury, upper abdominal infection; GI bleed, however find this less likely secondary to nontender abdomen.  Review of previous labs does show chronic mild anemia.   Initial Plan:  Evaluate serum hCG qualitative to evaluate for potential pregnancy. Screening labs including CBC and Metabolic panel to evaluate for  infectious or metabolic etiology of disease.  Urinalysis with reflex culture ordered to evaluate for UTI or relevant urologic/nephrologic pathology.  Obtain CT of the head to evaluate for potential acute intracranial bleed. EKG to evaluate for cardiac pathology Objective evaluation as below reviewed   Initial Study Results:   Laboratory  All laboratory results reviewed without evidence of clinically relevant pathology.   Exceptions include: Mild anemia 10.4 with normal MCV.  Review of previous labs shows this is chronic.  EKG EKG was reviewed independently. Rate, rhythm, axis, intervals all examined and without medically relevant abnormality. ST segments without concerns for elevations.    Radiology:  All images reviewed independently. Agree with radiology report at this time.   CT HEAD WO CONTRAST Result Date: 05/12/2024 EXAM: CT HEAD WITHOUT CONTRAST 05/12/2024 01:47:58 PM TECHNIQUE: CT of the head was performed without the administration of intravenous contrast. Automated exposure control, iterative reconstruction, and/or weight based adjustment of the mA/kV was utilized to reduce the radiation dose to as low as reasonably achievable. COMPARISON: None available. CLINICAL HISTORY: Syncope/presyncope, cerebrovascular cause suspected FINDINGS: BRAIN AND VENTRICLES: No acute hemorrhage. No evidence of acute infarct. No hydrocephalus. No extra-axial  collection. No mass effect or midline shift. ORBITS: No acute abnormality. SINUSES: Clear paranasal sinuses. SOFT TISSUES AND SKULL: No acute soft tissue abnormality. No skull fracture. Well-aerated mastoid air cells. IMPRESSION: 1. No acute intracranial abnormality. Electronically signed by: Lonni Necessary MD 05/12/2024 02:19 PM EST RP Workstation: [id number]      Reassessment and Plan:   Physical exam shows a nontender abdomen with soft palpation, no masses appreciated, no distention.  Review of the imaging obtained does not show any concerns for any acute intracranial bleed or other intracranial process as cause of her near syncope.  She is in process of receiving 1 L fluid bolus with plan to reassess orthostatic vital signs.  Anticipate discharge with normal orthostatic vital signs and clinical improvement on reassessment.  At time of care handoff patient did state that she did feel clinically improved however was still having some generalized fatigue.  Care signed out to J. Lang, [place]       Final diagnoses:  Near syncope    ED Discharge Orders     None          Myriam Dorn BROCKS, [place] 05/13/24 0815    Ellouise Fine K, DO 05/13/24 1255  "

## 2024-05-12 NOTE — ED Provider Notes (Signed)
 Patient signed out to me by PA Dorn Dec.  Had a syncopal episode today.  Workup here unremarkable.  Thought to be orthostatic versus vasovagal etiology.  Plan at this time is IV fluids and reassess and likely discharge with PCP follow-up. Physical Exam  BP 103/75   Pulse 74   Temp 98.4 F (36.9 C) (Oral)   Resp 16   Ht 5' 4 (1.626 m)   Wt 90.7 kg   LMP 03/27/2024 (Approximate)   SpO2 99%   BMI 34.33 kg/m   Physical Exam  Procedures  Procedures  ED Course / MDM   Clinical Course as of 05/12/24 1603  Fri May 12, 2024  1523 Syncope at home. Donates plasma routinely. Likely orthostatic hypotension. Fluids and reasses and likely home.  [JR]    Clinical Course User Index [JR] Lang Norleen POUR, PA-C   Medical Decision Making Amount and/or Complexity of Data Reviewed Labs: ordered. Radiology: ordered.   On reassessment, patient states she was feeling much better and expressed desire to go home.  She denies any urinary symptoms or abdominal pain thus I canceled the urinalysis.  Advised her to follow-up with PCP and continue assertive hydration and advance her diet as tolerated.  Discussed return precautions.  Discharge.       Lang Norleen POUR, PA-C 05/12/24 1604    Ula Prentice SAUNDERS, MD 05/13/24 [phone]

## 2024-05-12 NOTE — Discharge Instructions (Addendum)
 Evaluation was overall reassuring.  As we discussed please follow-up your PCP.  Please continue assertive hydration with water and Gatorade at home.  If you pass out again, have chest pain or any other concerning symptom please return to the ED for further evaluation.

## 2024-05-12 NOTE — ED Triage Notes (Signed)
 BIB EMS from work for general malaise feeling. C/o RUQ abdominal tenderness, lightheadedness, and weakness. Denies drugs or alcohol use.
